# Patient Record
Sex: Female | Born: 1970 | Hispanic: No | Marital: Single | Smoking: Never smoker
Health system: Southern US, Community
[De-identification: ages and names within clinical notes are randomized; demographics above are authoritative.]

## PROBLEM LIST (undated history)

## (undated) DIAGNOSIS — Z8776 Personal history of (corrected) congenital malformations of integument, limbs and musculoskeletal system: Secondary | ICD-10-CM

## (undated) DIAGNOSIS — Z8639 Personal history of other endocrine, nutritional and metabolic disease: Secondary | ICD-10-CM

## (undated) DIAGNOSIS — Z87768 Personal history of other specified (corrected) congenital malformations of integument, limbs and musculoskeletal system: Secondary | ICD-10-CM

## (undated) DIAGNOSIS — G43909 Migraine, unspecified, not intractable, without status migrainosus: Secondary | ICD-10-CM

## (undated) DIAGNOSIS — E079 Disorder of thyroid, unspecified: Secondary | ICD-10-CM

## (undated) HISTORY — DX: Personal history of other specified (corrected) congenital malformations of integument, limbs and musculoskeletal system: Z87.768

## (undated) HISTORY — DX: Personal history of other endocrine, nutritional and metabolic disease: Z86.39

## (undated) HISTORY — DX: Personal history of (corrected) congenital malformations of integument, limbs and musculoskeletal system: Z87.76

---
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
## 2009-12-11 ENCOUNTER — Ambulatory Visit: Payer: Self-pay

## 2010-01-14 ENCOUNTER — Encounter: Payer: Self-pay | Admitting: Surgery

## 2010-01-20 ENCOUNTER — Ambulatory Visit: Payer: Self-pay | Admitting: Internal Medicine

## 2010-01-29 ENCOUNTER — Encounter: Payer: Self-pay | Admitting: Surgery

## 2010-03-01 ENCOUNTER — Encounter: Payer: Self-pay | Admitting: Surgery

## 2010-05-27 ENCOUNTER — Encounter: Payer: Self-pay | Admitting: Surgery

## 2010-05-31 ENCOUNTER — Encounter: Payer: Self-pay | Admitting: Surgery

## 2010-06-30 ENCOUNTER — Encounter: Payer: Self-pay | Admitting: Surgery

## 2011-01-04 ENCOUNTER — Ambulatory Visit: Payer: Self-pay | Admitting: Internal Medicine

## 2011-06-29 ENCOUNTER — Ambulatory Visit: Payer: Self-pay | Admitting: Internal Medicine

## 2011-07-26 ENCOUNTER — Ambulatory Visit: Payer: Self-pay | Admitting: Internal Medicine

## 2013-07-17 IMAGING — NM NM THYROID IMAGING W/ UPTAKE SINGLE (24 HR)
1 series · 3 of 3 positions shown · non-contrast
Comparison: none

REASON FOR EXAM: goiter thyroid nodules
COMMENTS:

[Series 1000: (id) thyroid scan · 2.40mm/px · 3 of 3 slices shown]
[im 1/3  full-range]
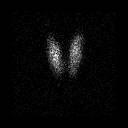
[im 2/3  full-range]
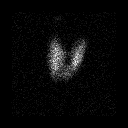
[im 3/3  full-range]
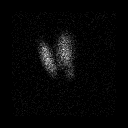

[3 of 3 positions shown; findings below may reference images not displayed]

PROCEDURE:     KNM - KNM THYROID N-TMO 24HR [DATE]  [DATE]

RESULT:     The patient received a dose of 146.037 uCi of iodine-385. Uptake
at 6 and 26 hours respectively is 14% and 24.1%. Images show relatively
homogeneous uptake diffusely in the glands without areas of abnormally
increased or decreased localization present.
IMPRESSION: Normal appearing thyroid scan and uptake.

## 2014-01-08 IMAGING — US TRANSABDOMINAL ULTRASOUND OF PELVIS
1 series · 17 of 25 positions shown · non-contrast
Comparison: none

REASON FOR EXAM: Enlarged Uterus
COMMENTS:

[Series 1: transabdominal ultrasound of pelvis · 17 of 88 slices shown]
[im 1/88]
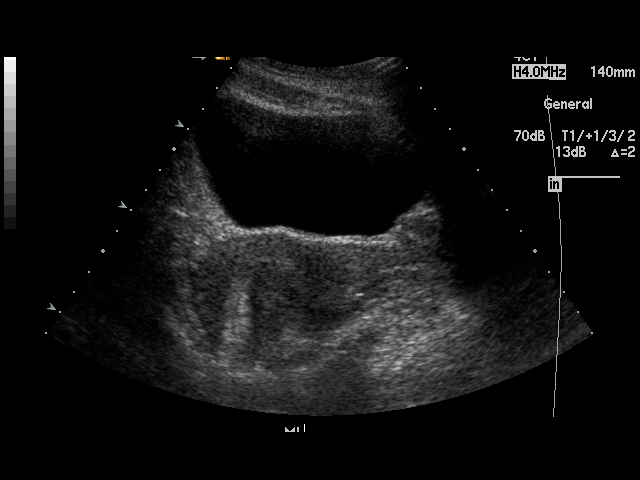
[im 8/88]
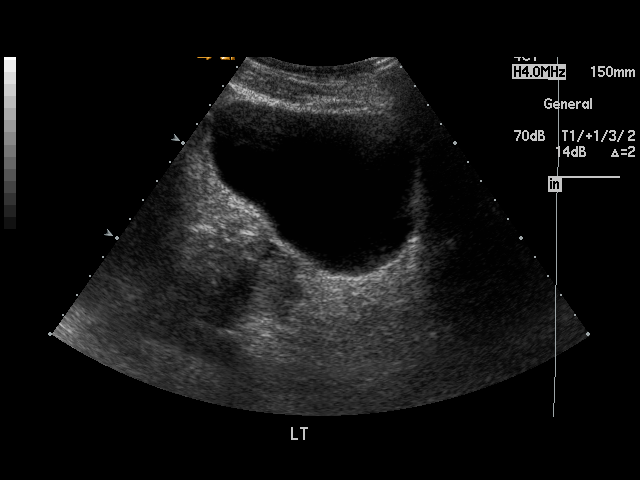
[im 11/88]
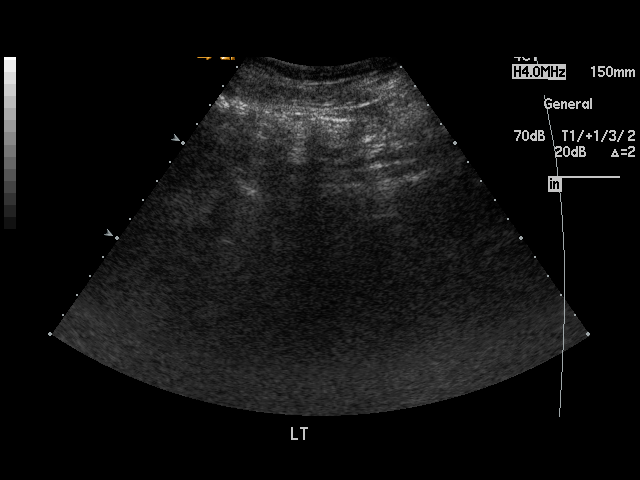
[im 19/88]
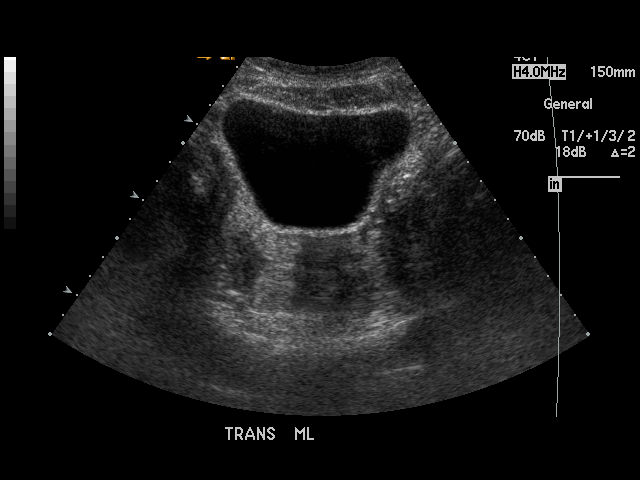
[im 22/88]
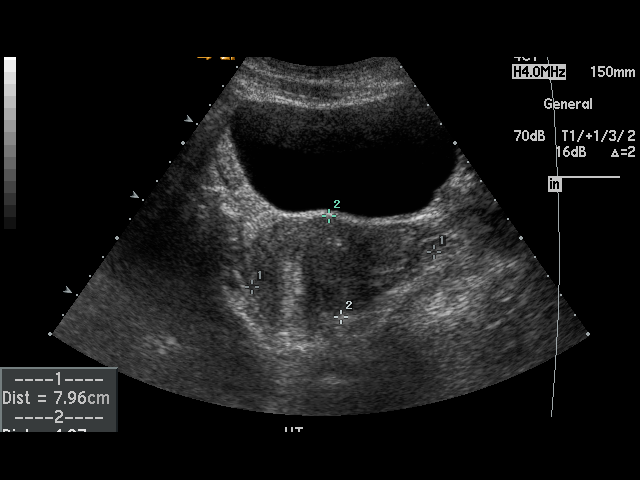
[im 30/88]
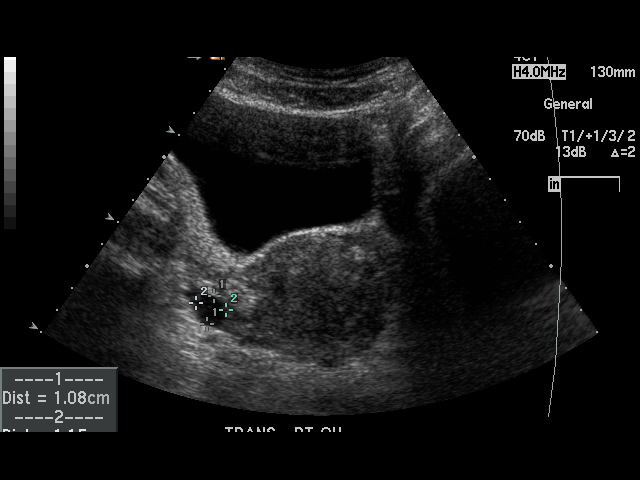
[im 33/88]
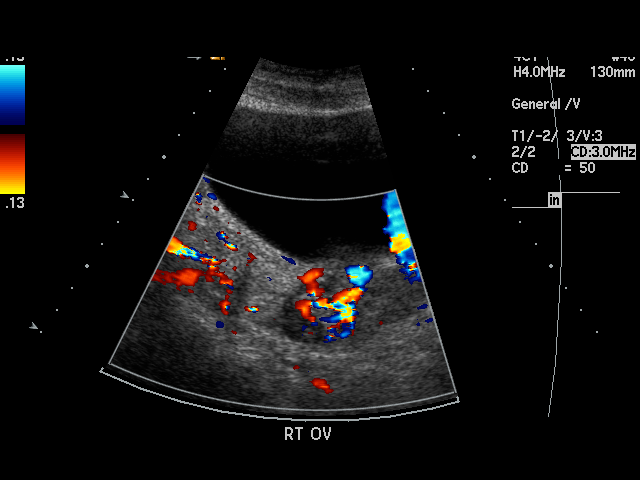
[im 40/88]
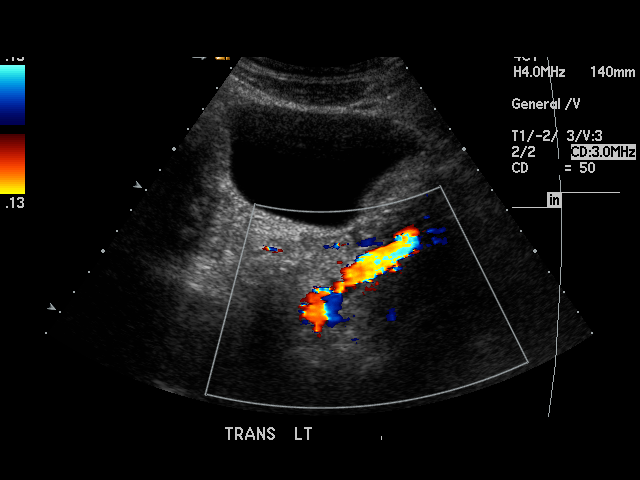
[im 44/88]
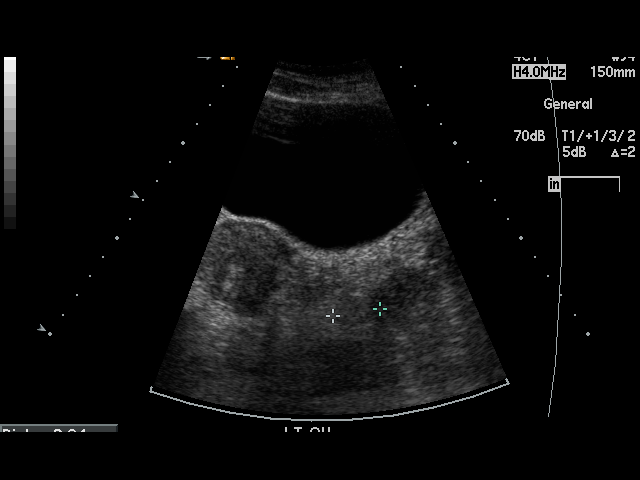
[im 48/88]
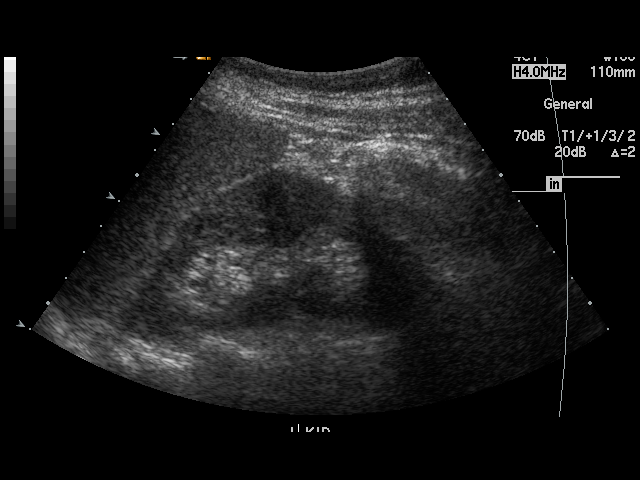
[im 55/88]
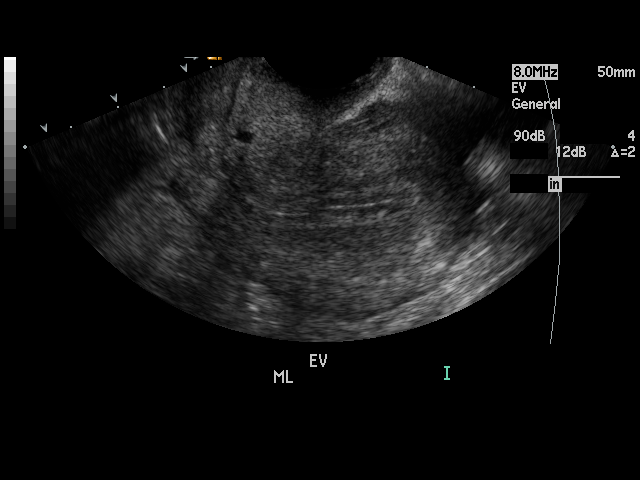
[im 59/88]
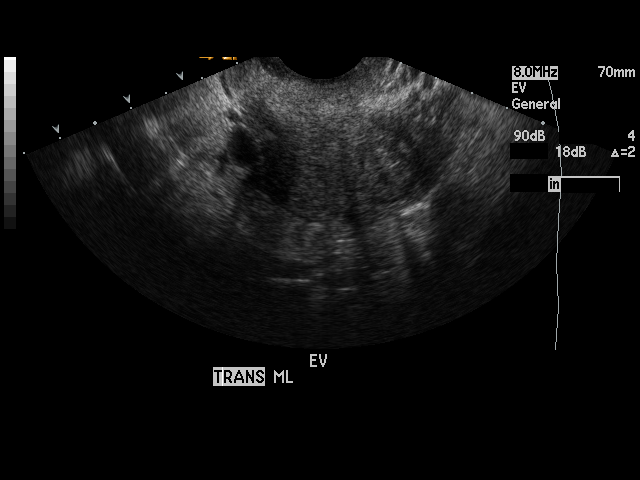
[im 66/88]
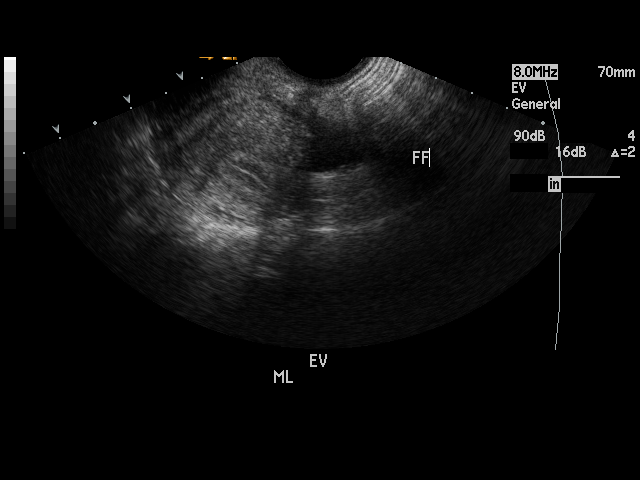
[im 69/88]
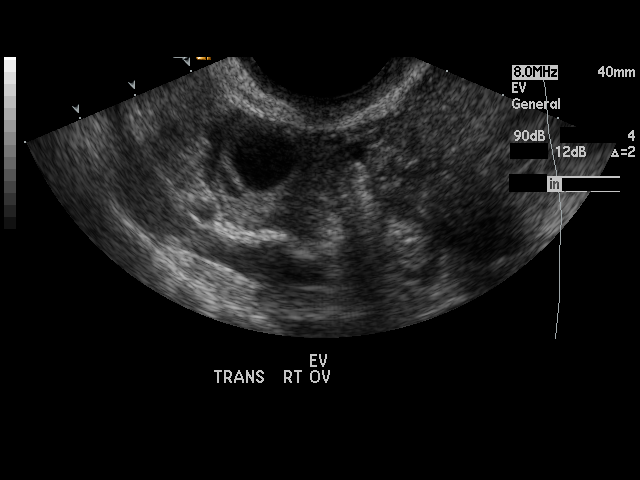
[im 77/88]
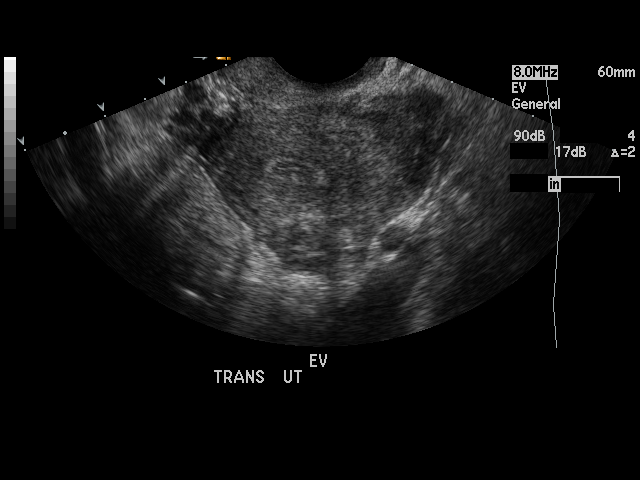
[im 80/88]
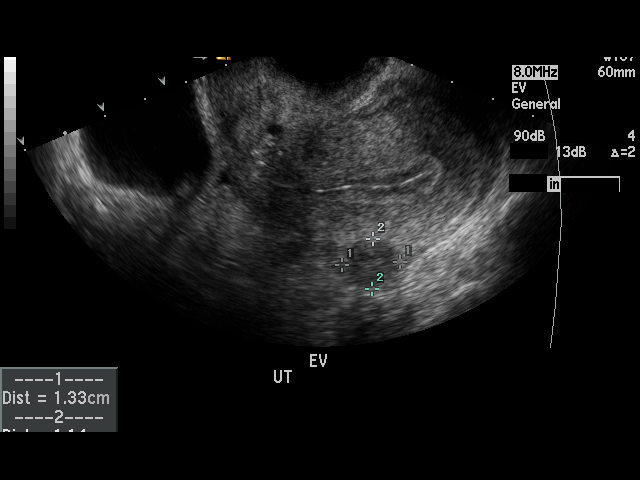
[im 88/88]
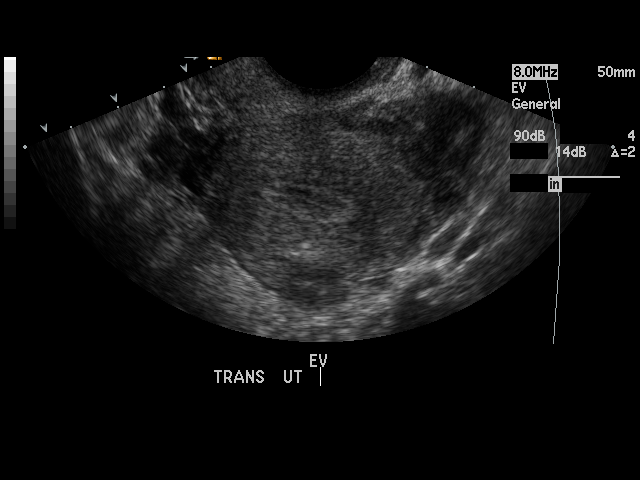

[17 of 25 positions shown; findings below may reference images not displayed]

PROCEDURE:     KURTIS - KURTIS PELVIS NON-OB W/TRANSVAGINAL  - June 29, 2011  [DATE]

RESULT:     Transabdominal and endovaginal ultrasound was performed. The
uterus measures 7.96 cm x 4.37 cm x 5.54 cm. There is a 1.72 cm hypoechoic
uterine mass consistent with a uterine fibroid that is located posteriorly
and peripherally. The endometrium measures 7.3 mm in thickness. The right
and left ovaries are visualized. The right ovary measures 2.67 cm at maximum
and the left ovary measures 2.31 cm at maximum diameter. Incidental note is
made of a 1.45 cm cyst of the right ovary. No abnormal adnexal masses are
seen. No free fluid is noted in the pelvis. There is incidentally noted a
4.5 mm cyst of the cervix. The uterus appears retroflexed. There is a
nonspecific trace of free fluid in the pelvis. The visualized portion of the
urinary bladder is normal in appearance. The kidneys are visualized
bilaterally and show no hydronephrosis.
IMPRESSION: 1. There is a 1.72 cm hypoechoic mass of the uterus consistent with a
uterine fibroid.
2. There is a nonspecific trace of free fluid in the pelvis.
3. Incidental note is made of a 1.45 cm cyst of the right ovary.
4. There is a tiny nabothian cyst.
5. The uterus appears retroflexed.

## 2014-02-04 IMAGING — MG MAM DGTL SCREENING MAMMO W/CAD
1 series · 4 of 4 positions shown · non-contrast
Comparison: none

REASON FOR EXAM: SCR MAMMO
COMMENTS:

PROCEDURE:     MAM - MAM DGTL SCREENING MAMMO W/CAD  - July 26, 2011  [DATE]
RESULT:     COMPARISON:  01/20/2010
TECHNIQUE: Digital screening mammograms were obtained. FDA approved
computer-aided detection (CAD) for mammography was utilized for this study.

[R CC · right · 4 of 4 slices shown]
[im 1/4]
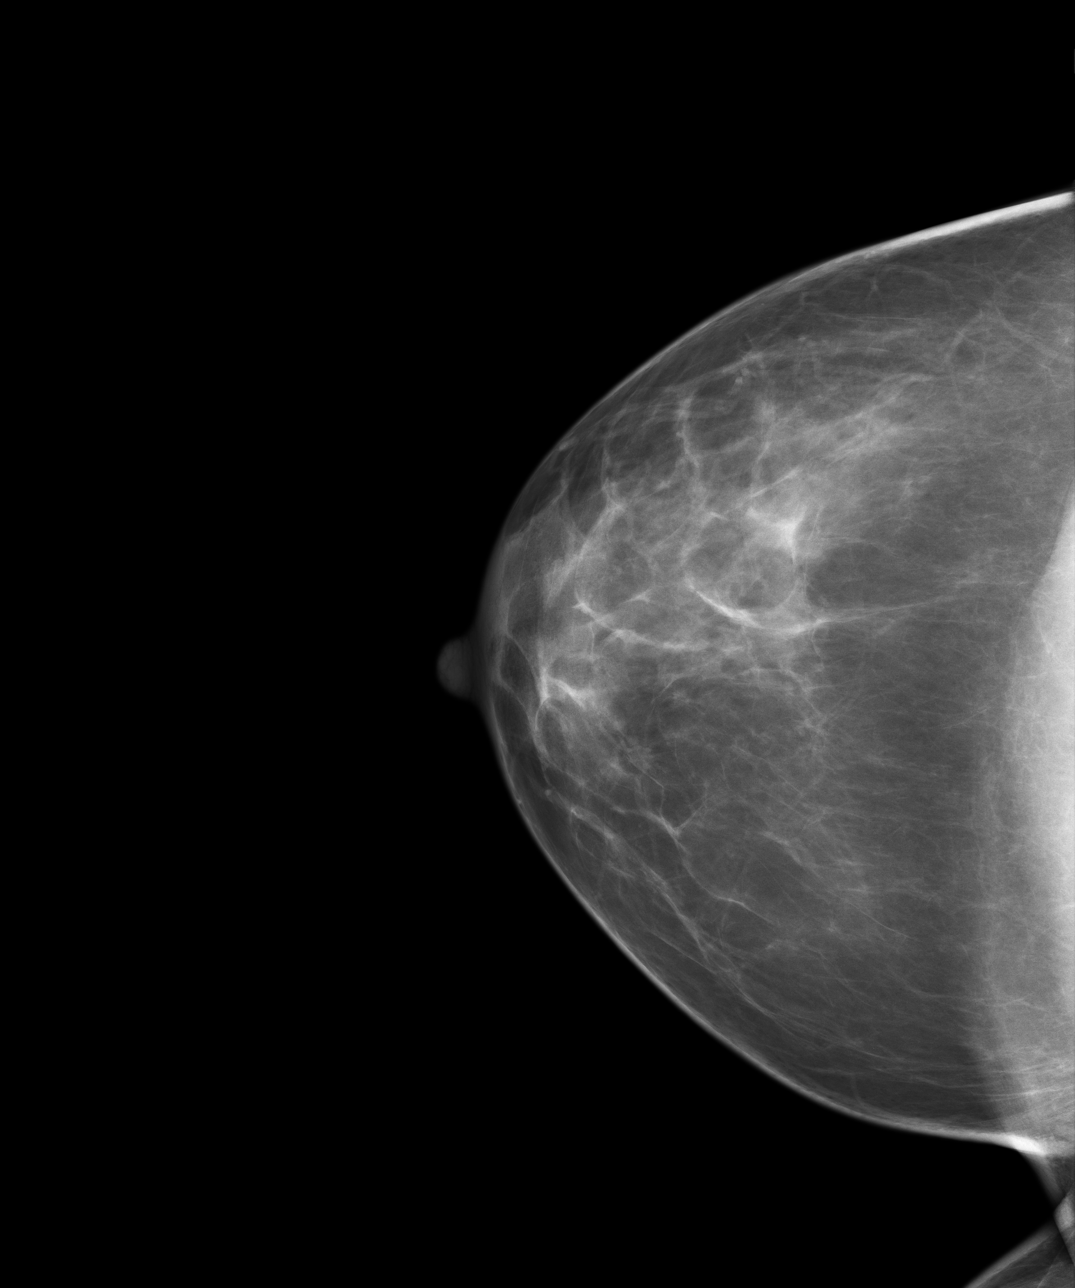
[im 2/4]
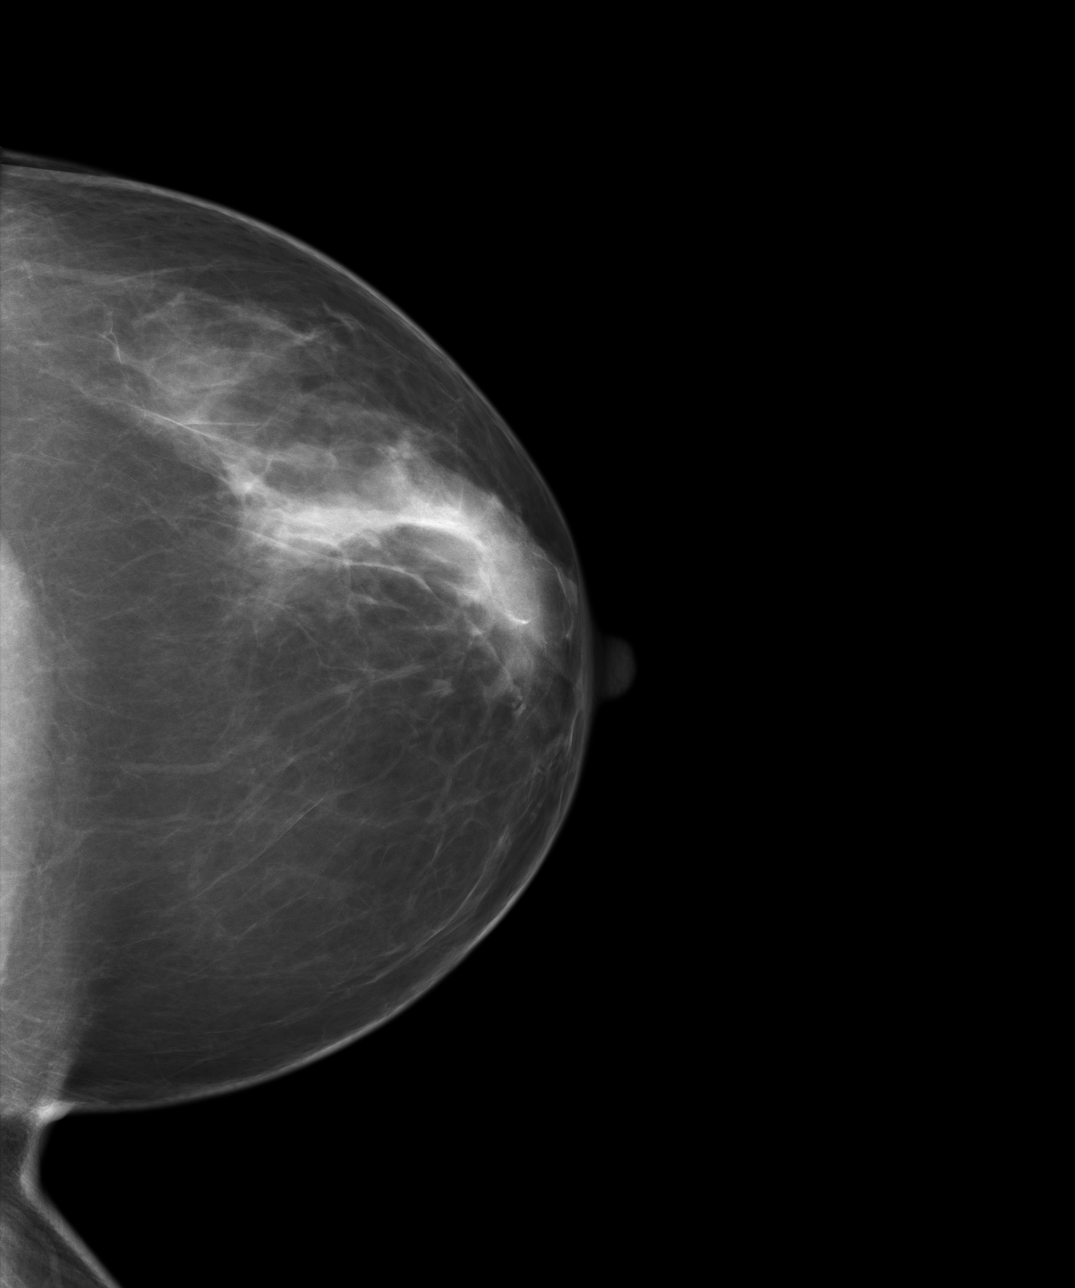
[im 3/4]
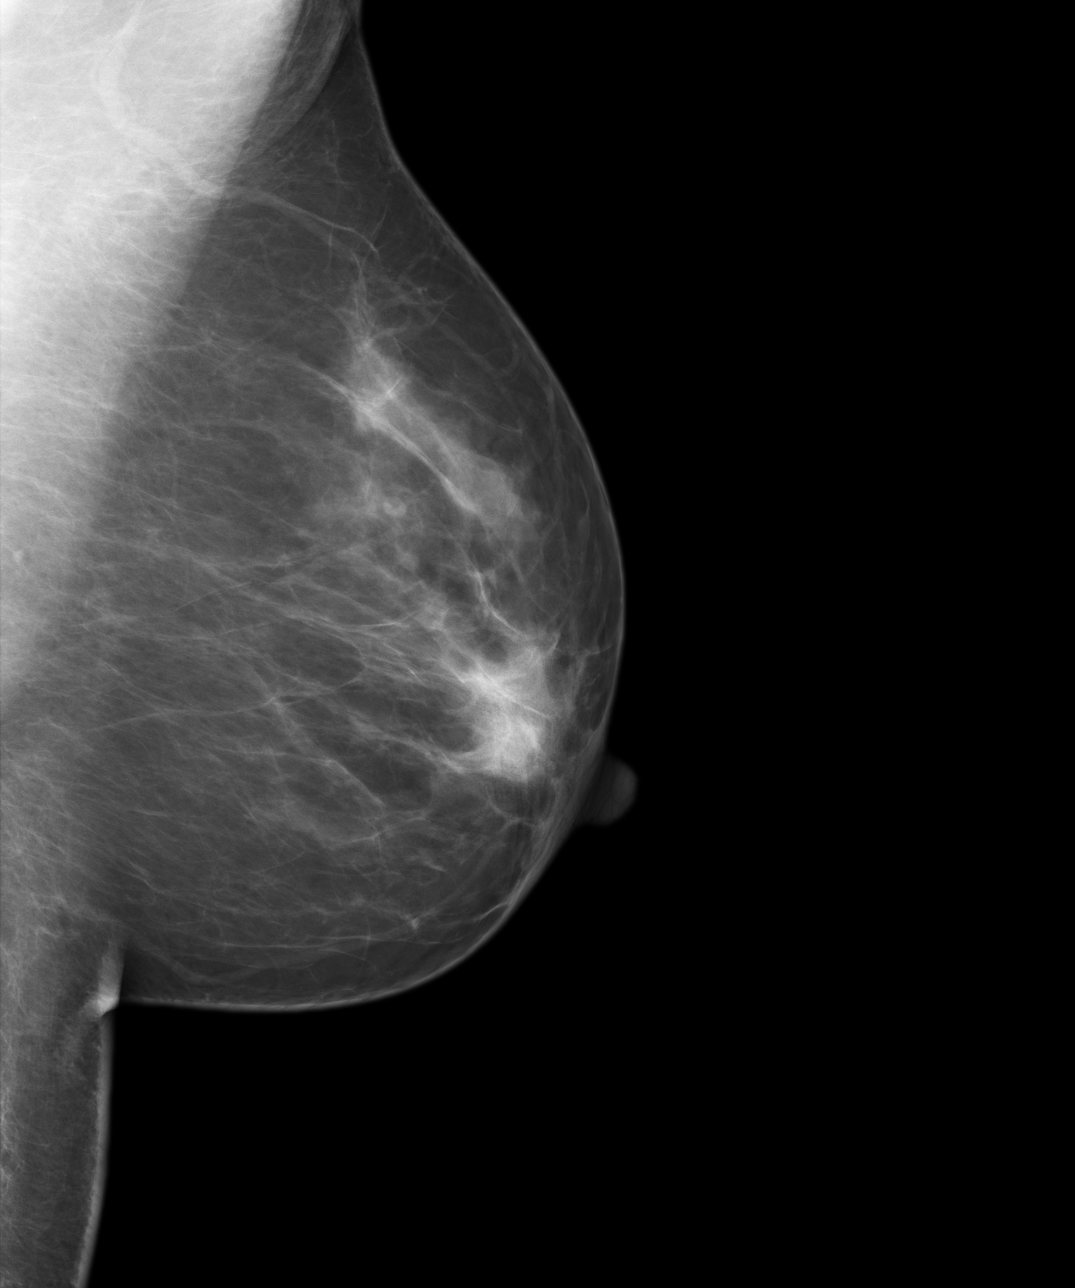
[im 4/4]
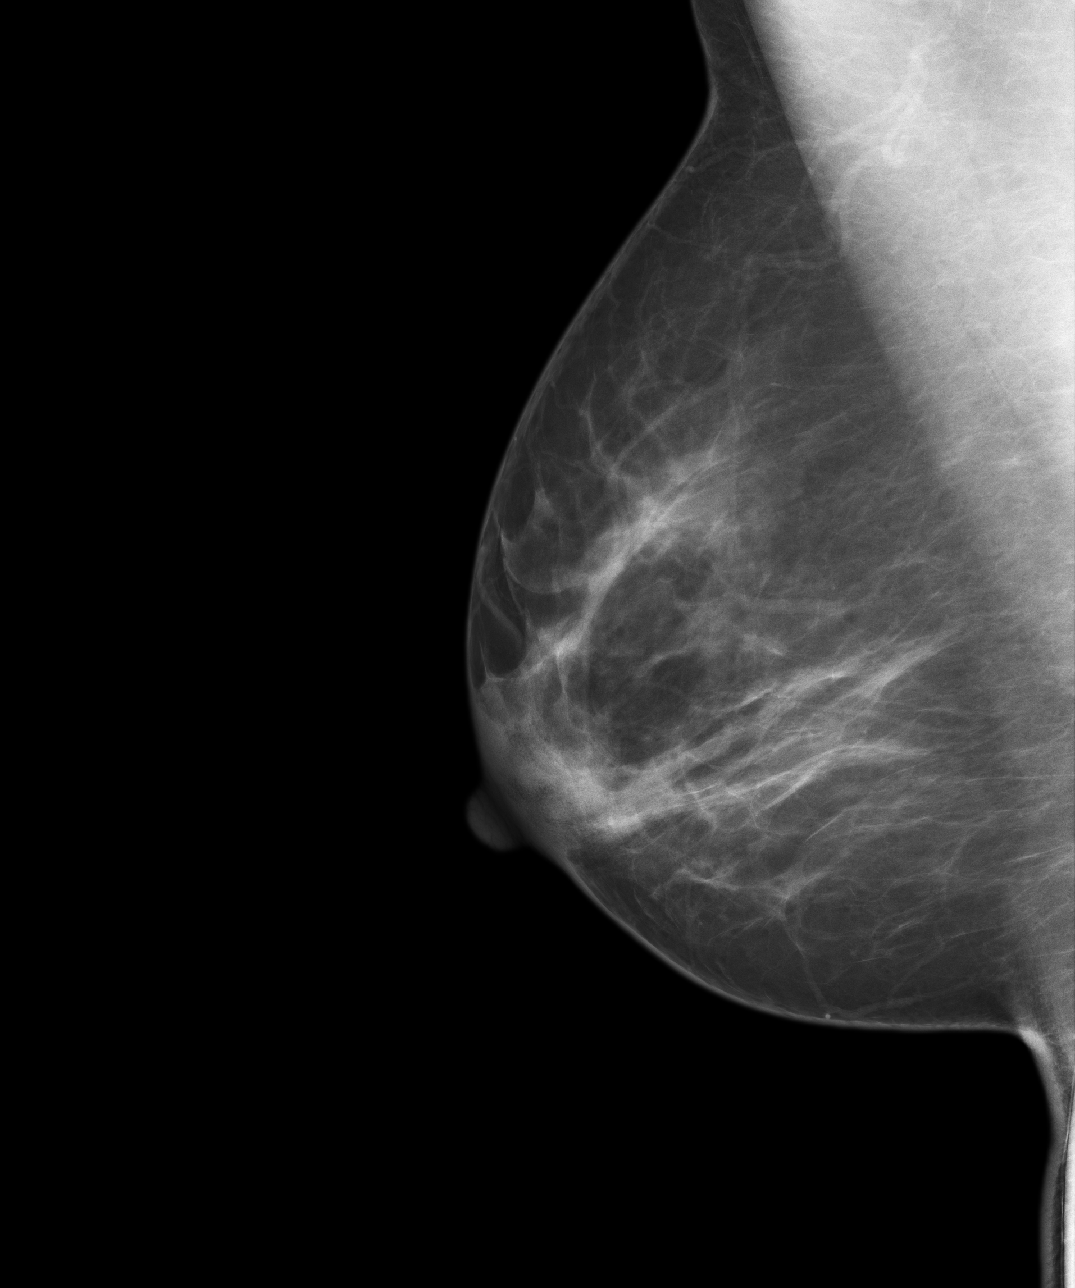

[4 of 4 positions shown; findings below may reference images not displayed]

FINDING: Bilateral breasts are heterogeneously dense which may lower the sensitivity
of mammography.  There is no dominant mass, architectural distortion or
clusters of suspicious microcalcifications.
IMPRESSION: 1.     Stable bilateral mammogram.
2.     Annual mammographic follow up recommended.
3.     BI-RADS:  Category 2- Benign.

A negative mammogram report does not preclude biopsy or other evaluation of
a clinically palpable or otherwise suspicious mass or lesion. Breast cancer
may not be detected by mammography in up to 10% of cases.

[REDACTED]

## 2018-11-23 ENCOUNTER — Encounter (HOSPITAL_COMMUNITY): Payer: Self-pay

## 2018-11-23 ENCOUNTER — Other Ambulatory Visit: Payer: Self-pay

## 2018-11-23 ENCOUNTER — Emergency Department (HOSPITAL_COMMUNITY)
Admission: EM | Admit: 2018-11-23 | Discharge: 2018-11-23 | Disposition: A | Discharge status: Home / Self Care | Payer: Medicaid Other | Attending: Emergency Medicine | Admitting: Emergency Medicine

## 2018-11-23 DIAGNOSIS — R51 Headache: Secondary | ICD-10-CM | POA: Insufficient documentation

## 2018-11-23 DIAGNOSIS — R519 Headache, unspecified: Secondary | ICD-10-CM

## 2018-11-23 HISTORY — DX: Migraine, unspecified, not intractable, without status migrainosus: G43.909

## 2018-11-23 LAB — I-STAT BETA HCG BLOOD, ED (MC, WL, AP ONLY): I-stat hCG, quantitative: <5 m[IU]/mL (ref <5)

## 2018-11-23 MED ORDER — KETOROLAC TROMETHAMINE 15 MG/ML IJ SOLN
15.0000 mg | Freq: Once | INTRAMUSCULAR | Status: AC
  Administered 2018-11-23: 15 mg via INTRAVENOUS
  Filled 2018-11-23: qty 1

## 2018-11-23 MED ORDER — SODIUM CHLORIDE 0.9 % IV BOLUS
500.0000 mL | Freq: Once | INTRAVENOUS | Status: AC
  Administered 2018-11-23: 500 mL via INTRAVENOUS

## 2018-11-23 MED ORDER — DIPHENHYDRAMINE HCL 50 MG/ML IJ SOLN
25.0000 mg | Freq: Once | INTRAMUSCULAR | Status: AC
  Administered 2018-11-23: 25 mg via INTRAVENOUS
  Filled 2018-11-23: qty 1

## 2018-11-23 MED ORDER — METOCLOPRAMIDE HCL 5 MG/ML IJ SOLN
10.0000 mg | Freq: Once | INTRAMUSCULAR | Status: AC
  Administered 2018-11-23: 10 mg via INTRAVENOUS
  Filled 2018-11-23: qty 2

## 2018-11-23 NOTE — ED Triage Notes (Signed)
Pt reports migraine since Tuesday, hx of same. Pt recently moved to [facility] and is waiting to apply for medicaid to get a refill of her migraine rx.

## 2018-11-23 NOTE — Discharge Instructions (Addendum)
You have been diagnosed today with headache.  At this time there does not appear to be the presence of an emergent medical condition, however there is always the potential for conditions to change. Please read and follow the below instructions.  Please return to the Emergency Department immediately for any new or worsening symptoms. Please be sure to follow up with your Primary Care Provider within one week regarding your visit today; please call their office to schedule an appointment even if you are feeling better for a follow-up visit. As requested you may follow-up with a local neurologist at [facility] neurology for further evaluation and treatment of your migraines.  You may call their office today to schedule a follow-up appointment. Please get plenty of rest and drink plenty of water over the next few days.  Get help right away if: Your headache gets very bad quickly. Your headache gets worse after a lot of physical activity. You keep throwing up. You have a stiff neck. You have trouble seeing. You have trouble speaking. You have pain in the eye or ear. Your muscles are weak or you lose muscle control. You lose your balance or have trouble walking. You feel like you will pass out (faint) or you pass out. You are mixed up (confused). You have a seizure. Your headache is abnormal from your normal migraines in any way. You have any new/concerning or worsening symptoms.  Please read the additional information packets attached to your discharge summary.  Do not take your medicine if  develop an itchy rash, swelling in your mouth or lips, or difficulty breathing; call 911 and seek immediate emergency medical attention if this occurs.  Note: Portions of this text may have been transcribed using voice recognition software. Every effort was made to ensure accuracy; however, inadvertent computerized transcription errors may still be present.

## 2018-11-23 NOTE — ED Provider Notes (Signed)
[facility] EMERGENCY DEPARTMENT Provider Note   CSN: [id number] Arrival date & time: 11/23/18  0844     History   Chief Complaint Chief Complaint  Patient presents with   Migraine    HPI SENAI RAMNATH is a 48 y.o. female with history of migraine presents to the ER today for the same.  She reports that she recently moved to [place] and has run out of her Imitrex prescription, she is currently awaiting refill at this time.  She reports that 2 days ago she developed a headache consistent with her previous migraines.  She describes a right-sided throbbing sensation moderate intensity constant worsened with light and without alleviating factors.  She reports that she has had 1-2 episodes of nonbloody nonbilious emesis when pain was at its greatest and she reports this is consistent with her normal migraines.  She denies fever/chills, vision changes, neck pain/stiffness, chest pain/shortness of breath, numbness/tingling, weakness, dizziness, injury/trauma or any additional concerns.   Patient seen in conjunction with [employer] and PA student Marjory Lies. HPI  Past Medical History:  Diagnosis Date   Migraine    Thyroid disease    nodule on thyroid    There are no active problems to display for this patient.   Past Surgical History:  Procedure Laterality Date   HIP SURGERY       OB History   No obstetric history on file.      Home Medications    Prior to Admission medications   Not on File    Family History No family history on file.  Social History Social History   Tobacco Use   Smoking status: Never Smoker  Substance Use Topics   Alcohol use: Yes    Comment: occaissionally   Drug use: Never     Allergies   Patient has no allergy information on record.   Review of Systems Review of Systems Ten systems are reviewed and are negative for acute change except as noted in the HPI   Physical Exam Updated Vital Signs BP 115/85 (BP  Location: Right Arm)    Pulse 90    Temp 98.6 F (37 C) (Oral)    Resp 20    Ht 5\' 1"  (1.549 m)    Wt 63.5 kg    SpO2 99%    BMI 26.45 kg/m   Physical Exam Constitutional:      General: She is not in acute distress.    Appearance: Normal appearance. She is well-developed. She is not ill-appearing or diaphoretic.  HENT:     Head: Normocephalic and atraumatic. No raccoon eyes, Battle's sign, abrasion or contusion.     Jaw: There is normal jaw occlusion. No trismus.     Right Ear: External ear normal.     Left Ear: External ear normal.     Ears:     Comments: Hearing grossly intact bilaterally    Nose: Nose normal. No rhinorrhea.     Right Nostril: No epistaxis.     Left Nostril: No epistaxis.     Mouth/Throat:     Mouth: Mucous membranes are moist.     Pharynx: Oropharynx is clear.  Eyes:     General: Vision grossly intact. Gaze aligned appropriately.     Extraocular Movements: Extraocular movements intact.     Conjunctiva/sclera: Conjunctivae normal.     Pupils: Pupils are equal, round, and reactive to light.     Comments: Visual fields grossly intact bilaterally  Neck:  Musculoskeletal: Full passive range of motion without pain, normal range of motion and neck supple. No neck rigidity.     Trachea: Trachea and phonation normal. No tracheal tenderness or tracheal deviation.     Meningeal: Brudzinski's sign absent.  Cardiovascular:     Rate and Rhythm: Normal rate and regular rhythm.     Pulses:          Dorsalis pedis pulses are 2+ on the right side and 2+ on the left side.     Heart sounds: Normal heart sounds.  Pulmonary:     Effort: Pulmonary effort is normal. No respiratory distress.     Breath sounds: Normal breath sounds and air entry.  Abdominal:     General: Bowel sounds are normal. There is no distension.     Palpations: Abdomen is soft.     Tenderness: There is no abdominal tenderness. There is no guarding or rebound.  Musculoskeletal:     Comments: No midline  C/T/L spinal tenderness to palpation, no paraspinal muscle tenderness, no deformity, crepitus, or step-off noted. No sign of injury to the neck or back.  Feet:     Right foot:     Protective Sensation: 3 sites tested. 3 sites sensed.     Left foot:     Protective Sensation: 3 sites tested. 3 sites sensed.  Skin:    General: Skin is warm and dry.  Neurological:     Mental Status: She is alert and oriented to person, place, and time.     GCS: GCS eye subscore is 4. GCS verbal subscore is 5. GCS motor subscore is 6.     Comments: Mental Status: Alert, oriented, thought content appropriate, able to give a coherent history. Speech fluent without evidence of aphasia. Able to follow 2 step commands without difficulty. Cranial Nerves: II: Peripheral visual fields grossly normal, pupils equal, round, reactive to light III,IV, VI: ptosis not present, extra-ocular motions intact bilaterally V,VII: smile symmetric, eyebrows raise symmetric, facial light touch sensation equal VIII: hearing grossly normal to voice X: uvula elevates symmetrically XI: bilateral shoulder shrug symmetric and strong XII: midline tongue extension without fassiculations Motor: Normal tone. 5/5 strength in upper and lower extremities bilaterally including strong and equal grip strength and dorsiflexion/plantar flexion Sensory: Sensation intact to light touch in all extremities.Negative Romberg.  Deep Tendon Reflexes: 2+ and symmetric in the biceps and patella Cerebellar: normal finger-to-nose maze with bilateral upper extremities. Normal heel-to -shin balance bilaterally of the lower extremity. No pronator drift.  Gait: normal gait and balance CV: distal pulses palpable throughout  Psychiatric:        Mood and Affect: Mood normal.        Behavior: Behavior is cooperative.      ED Treatments / Results  Labs (all labs ordered are listed, but only abnormal results are displayed) Labs Reviewed  I-STAT BETA HCG  BLOOD, ED ([facility], [facility], [facility] ONLY)    EKG None  Radiology No results found.  Procedures Procedures (including critical care time)  Medications Ordered in ED Medications  diphenhydrAMINE (BENADRYL) injection 25 mg (25 mg Intravenous Given 11/23/18 1423)  metoCLOPramide (REGLAN) injection 10 mg (10 mg Intravenous Given 11/23/18 1424)  sodium chloride 0.9 % bolus 500 mL (0 mLs Intravenous Stopped 11/23/18 1457)  ketorolac (TORADOL) 15 MG/ML injection 15 mg (15 mg Intravenous Given 11/23/18 1559)     Initial Impression / Assessment and Plan / ED Course  I have reviewed the triage vital signs and the nursing  notes.  Pertinent labs & imaging results that were available during my care of the patient were reviewed by me and considered in my medical decision making (see chart for details).    Chucky Mayoya T Skaggs is a 48 y.o. female who presents to ED for 2 days of headache. Patient states that their headache is consistent with their typical headache in the past.  Physical examination reassuring without focal neuro deficit.  No history of syncope, sudden onset, neck stiffness, trauma, recent pregnancy or pain about the temporal or cervical arteries.  She is overall well-appearing and in no acute distress.  No imaging indicated at this time.  Patient treated with migraine cocktail of IV fluids, Benadryl, Reglan and Toradol, of note patient denies history of CKD or gastric ulcers, negative pregnancy test today. - Patient reassessed she reports headache is improved in the ER and is requesting to be discharged back to home.  On re-evaluation, patient sleeping easily arousable.  The patient denies any neurologic symptoms such as visual changes, focal numbness/weakness, balance problems, confusion, or speech difficulty to suggest a life-threatening intracranial process such as intracranial hemorrhage or mass. The patient has no clotting risk factors thus venous sinus thrombosis is unlikely. No fevers, neck pain or  nuchal rigidity to suggest meningitis. Patient is afebrile, non-toxic and well appearing. Reassuring neuro exam, normal gait around room and back. No cranial deficits, no speech deficits, negative pronator drift, normal/equal strength to all extremities.   PCP follow up encouraged.  As patient is returning to this area she is requesting become reestablished with a local neurologist, I have given [facility] for further migraine treatment.  She reports that her primary care provider is to refill the meds. I have reviewed return precautions including development of fever, nausea/vomiting or neurologic symptoms, vision changes, confusion, lethargy, difficulty speaking/walking, or other new/worsening/concerning symptoms. Patient states understanding of return precautions.   At this time there does not appear to be any evidence of an acute emergency medical condition and the patient appears stable for discharge with appropriate outpatient follow up. Diagnosis was discussed with patient who verbalizes understanding of care plan and is agreeable to discharge. I have discussed return precautions with patient who verbalizes understanding of return precautions. Patient encouraged to follow-up with their PCP and neurology. All questions answered.  Patient has been discharged in good condition.   Note: Portions of this report may have been transcribed using voice recognition software. Every effort was made to ensure accuracy; however, inadvertent computerized transcription errors may still be present. Final Clinical Impressions(s) / ED Diagnoses   Final diagnoses:  Nonintractable headache, unspecified chronicity pattern, unspecified headache type    ED Discharge Orders    None       Elizabeth PalauMorelli, Bobbette Eakes A, PA-C 11/23/18 1728    Milagros Lollykstra, Richard S, MD 11/25/18 0201

## 2019-02-14 ENCOUNTER — Ambulatory Visit: Payer: Self-pay | Admitting: Family Medicine

## 2019-03-14 ENCOUNTER — Other Ambulatory Visit: Payer: Self-pay

## 2019-03-14 ENCOUNTER — Encounter: Payer: Self-pay | Admitting: Family Medicine

## 2019-03-14 ENCOUNTER — Ambulatory Visit: Payer: Medicaid Other | Attending: Family Medicine | Admitting: Family Medicine

## 2019-03-14 DIAGNOSIS — Z87768 Personal history of other specified (corrected) congenital malformations of integument, limbs and musculoskeletal system: Secondary | ICD-10-CM | POA: Insufficient documentation

## 2019-03-14 DIAGNOSIS — G43C0 Periodic headache syndromes in child or adult, not intractable: Secondary | ICD-10-CM | POA: Diagnosis not present

## 2019-03-14 DIAGNOSIS — Z96643 Presence of artificial hip joint, bilateral: Secondary | ICD-10-CM | POA: Insufficient documentation

## 2019-03-14 DIAGNOSIS — Z8776 Personal history of (corrected) congenital malformations of integument, limbs and musculoskeletal system: Secondary | ICD-10-CM | POA: Diagnosis not present

## 2019-03-14 DIAGNOSIS — Z1231 Encounter for screening mammogram for malignant neoplasm of breast: Secondary | ICD-10-CM

## 2019-03-14 MED ORDER — SUMATRIPTAN SUCCINATE 6 MG/0.5ML ~~LOC~~ SOLN: 6.0000 mg | SUBCUTANEOUS | 4 refills | Status: DC | PRN

## 2019-03-14 MED ORDER — IBUPROFEN 600 MG PO TABS: 600.0000 mg | ORAL_TABLET | Freq: Three times a day (TID) | ORAL | 2 refills | Status: DC | PRN

## 2019-03-14 NOTE — Progress Notes (Signed)
Pt states she has chronic migraines   Pt states she takes ibuprofen 800 and Imitrex injections   Pt states her migraines last 3-4 days   Pt states she has been neurologist

## 2019-03-14 NOTE — Progress Notes (Signed)
Virtual Visit via Telephone Note  I connected with April Callahan on 03/14/19 at  3:50 PM EST by telephone and verified that I am speaking with the correct person using two identifiers.   I discussed the limitations, risks, security and privacy concerns of performing an evaluation and management service by telephone and the availability of in person appointments. I also discussed with the patient that there may be a patient responsible charge related to this service. The patient expressed understanding and agreed to proceed.  Patient Location: home Provider Location: [facility] Others participating in call: none   History of Present Illness:       49 yo female who is seen to establish care. She has a history of migraines for which she has been on Imitrex injections.  She alternates the Imitrex with ibuprofen.  She is current of Imitrex since she moved to the area.  She believes that she was prescribed Topamax in the past as a preventative but was afraid to take the medication.  She sometimes has as little as 2 migraines per month and at the worst she has had up to 15 migraines in 1 month.  She reports a history of a thyroid nodule that was being monitored as the nodule is too small to be biopsied.. 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  She would like a referral to establish with orthopedics as she is new to the area after moving from [place] recently.  She believes that she will need a mammogram this year.  Her prior mammograms have been normal.  She believes that her last Pap smear was about 2 years ago and was normal at that time.  She has some mild fatigue but denies any issues with chest pain or palpitations, no shortness of breath or cough, no abdominal pain.  She does have some occasional nausea with her headaches but finds that medications for the treatment of nausea did not work for her.  She denies any urinary frequency, urgency or  dysuria.  Past Medical History:  Diagnosis Date  . Migraine   . Thyroid disease    nodule on thyroid    Past Surgical History:  Procedure Laterality Date  . HIP SURGERY      No family history on file.  Social History   Tobacco Use  . Smoking status: Never Smoker  Substance Use Topics  . Alcohol use: Yes    Comment: occaissionally  . Drug use: Never     No Known Allergies     Observations/Objective: No vital signs or physical exam conducted as visit was done via telephone  Assessment and Plan: 1. Periodic headache syndrome, not intractable She reports a history of migraine headaches for which she has seen neurology in the past.  She states that Imitrex injections have worked well for her in the past and she would like to have a new prescription.  She is also alternated the use of Imitrex with ibuprofen in the past and this worked well.  New prescriptions will be sent to patient's pharmacy and patient is to follow-up in approximately 4 months but sooner if she is having any problems or concerns. - SUMAtriptan (IMITREX) 6 MG/0.5ML SOLN injection; Inject 0.5 mLs (6 mg total) into the skin every 2 (two) hours as needed for migraine or headache. Maximum 2 shots per day  Dispense: 0.5 mL; Refill: 4 - ibuprofen (ADVIL) 600 MG tablet; Take 1 tablet (600 mg  total) by mouth every 8 (eight) hours as needed. Take after eating  Dispense: 60 tablet; Refill: 2  2. History of congenital dysplasia of hip 3. S/P hip replacement, bilateral She reports a history of congenital hip dysplasia for which she had bilateral hip replacements in 2011.  She will be referred to orthopedics to establish care for follow-up as she is new to the area. - Ambulatory referral to Orthopedic Surgery  4.  Screening for breast cancer Order placed for screening mammogram  Follow Up Instructions: 75-month follow-up of chronic issues including migraines, thyroid nodule and blood work; schedule for annual well exam      I discussed the assessment and treatment plan with the patient. The patient was provided an opportunity to ask questions and all were answered. The patient agreed with the plan and demonstrated an understanding of the instructions.   The patient was advised to call back or seek an in-person evaluation if the symptoms worsen or if the condition fails to improve as anticipated.  I provided 14 minutes of non-face-to-face time during this encounter.   Cain Saupe, MD

## 2019-03-15 ENCOUNTER — Telehealth: Payer: Self-pay | Admitting: General Practice

## 2019-03-15 NOTE — Telephone Encounter (Signed)
Patient called asking that you switch the SUMAtriptan (IMITREX) 6 MG/0.5ML SOLN injection [[id number]]  To the already preloaded medication like and epi pen and send it to  [facility] [address], [place] - [address]  [address] [place], [place] [address]  Phone:  [phone] Fax:  [phone]  DEA #:  --

## 2019-03-17 ENCOUNTER — Other Ambulatory Visit: Payer: Self-pay | Admitting: Family Medicine

## 2019-03-17 DIAGNOSIS — G43909 Migraine, unspecified, not intractable, without status migrainosus: Secondary | ICD-10-CM

## 2019-03-17 MED ORDER — SUMATRIPTAN SUCCINATE 6 MG/0.5ML ~~LOC~~ SOAJ: SUBCUTANEOUS | 5 refills | Status: DC

## 2019-03-17 NOTE — Progress Notes (Signed)
Patient ID: April Callahan, female   DOB: [birth year], 49 y.o.   MRN: [id number]   Patient requested Stat dose administration form of Imitrex.  New prescription sent to her pharmacy.

## 2019-03-19 NOTE — Telephone Encounter (Signed)
New RX was sent this weekend

## 2019-03-27 ENCOUNTER — Other Ambulatory Visit: Payer: Self-pay

## 2019-03-27 ENCOUNTER — Ambulatory Visit (INDEPENDENT_AMBULATORY_CARE_PROVIDER_SITE_OTHER): Payer: Medicaid Other | Admitting: Orthopaedic Surgery

## 2019-03-27 ENCOUNTER — Telehealth: Payer: Self-pay

## 2019-03-27 ENCOUNTER — Encounter: Payer: Self-pay | Admitting: Orthopaedic Surgery

## 2019-03-27 ENCOUNTER — Ambulatory Visit (INDEPENDENT_AMBULATORY_CARE_PROVIDER_SITE_OTHER): Payer: Medicaid Other

## 2019-03-27 VITALS — Ht 61.0 in | Wt 130.0 lb

## 2019-03-27 DIAGNOSIS — Z96642 Presence of left artificial hip joint: Secondary | ICD-10-CM

## 2019-03-27 DIAGNOSIS — Z96641 Presence of right artificial hip joint: Secondary | ICD-10-CM | POA: Diagnosis not present

## 2019-03-27 NOTE — Progress Notes (Signed)
Office Visit Note   Patient: April Callahan           Date of Birth: 1970/09/30           MRN: [id number] Visit Date: 03/27/2019              Requested by: Cain Saupe, MD [address] [place],  [place] [address] PCP: Patient, No Pcp Per   Assessment & Plan: Visit Diagnoses:  1. Status post total hip replacement, right   2. Status post total hip replacement, left     Plan: Impression is 10 years status post bilateral hip replacements.  These are stable and doing well.  We will see her back in about 2 years for follow-up.  Standing AP pelvis and lateral hips on return.  She is currently working and doing well with this.   Follow-Up Instructions: Return in about 2 years (around 03/26/2021).   Orders:  Orders Placed This Encounter  Procedures  . XR HIPS BILAT W OR W/O PELVIS 3-4 VIEWS   No orders of the defined types were placed in this encounter.     Procedures: No procedures performed   Clinical Data: No additional findings.   Subjective: Chief Complaint  Patient presents with  . Right Hip - Pain  . Left Hip - Pain    April Callahan is a very pleasant 49 year old female comes in today to establish care for her hip replacements that were performed in [place] 10 years ago.  She has done very well and reports no problems.  She has some soreness with prolonged walking but she does not take anything for pain.   Review of Systems  Constitutional: Negative.   HENT: Negative.   Eyes: Negative.   Respiratory: Negative.   Cardiovascular: Negative.   Endocrine: Negative.   Musculoskeletal: Negative.   Neurological: Negative.   Hematological: Negative.   Psychiatric/Behavioral: Negative.   All other systems reviewed and are negative.    Objective: Vital Signs: Ht 5\' 1"  (1.549 m)   Wt 130 lb (59 kg)   BMI 24.56 kg/m   Physical Exam Vitals and nursing note reviewed.  Constitutional:      Appearance: She is well-developed.  HENT:     Head: Normocephalic  and atraumatic.  Pulmonary:     Effort: Pulmonary effort is normal.  Abdominal:     Palpations: Abdomen is soft.  Musculoskeletal:     Cervical back: Neck supple.  Skin:    General: Skin is warm.     Capillary Refill: Capillary refill takes less than 2 seconds.  Neurological:     Mental Status: She is alert and oriented to person, place, and time.  Psychiatric:        Behavior: Behavior normal.        Thought Content: Thought content normal.        Judgment: Judgment normal.     Ortho Exam Bilateral hip exams show fully healed surgical scars.  She has painless range of motion of the hips. Specialty Comments:  No specialty comments available.  Imaging: XR HIPS BILAT W OR W/O PELVIS 3-4 VIEWS  Result Date: 03/27/2019 Stable total hip replacements without complication.  Small area of lucency between the interface superior cup and acetabulum    PMFS History: Patient Active Problem List   Diagnosis Date Noted  . Status post total hip replacement, left 03/27/2019  . Status post total hip replacement, right 03/27/2019  . History of congenital dysplasia of hip 03/14/2019  .  S/P hip replacement, bilateral 03/14/2019   Past Medical History:  Diagnosis Date  . History of congenital dysplasia of hip   . History of thyroid nodule    nodule on thyroid  . Migraine     Family History  Problem Relation Age of Onset  . Seizures Mother   . Heart disease Mother   . Osteoarthritis Father   . Colon cancer Maternal Grandmother   . Stomach cancer Maternal Grandfather     Past Surgical History:  Procedure Laterality Date  . Bilateral hip replacement Bilateral 2011   Social History   Occupational History  . Not on file  Tobacco Use  . Smoking status: Never Smoker  . Smokeless tobacco: Never Used  Substance and Sexual Activity  . Alcohol use: Yes    Comment: occaissionally  . Drug use: Never  . Sexual activity: Not on file

## 2019-03-27 NOTE — Telephone Encounter (Signed)
Please inform patient and see which option she would like to use

## 2019-03-27 NOTE — Telephone Encounter (Signed)
Patients Medicaid ins does not cover the Sumatriptan auto injectors.  They cover the tablet, nasal spray or vial only.  If appropriate, can you send in a script to [facility]

## 2019-04-05 ENCOUNTER — Ambulatory Visit: Payer: Medicaid Other | Admitting: Family Medicine

## 2019-04-11 NOTE — Telephone Encounter (Signed)
Pt requests the Sumatriptan Nasal spray be sent to the [facility] at [facility].  This should be covered under her Medicaid.

## 2019-04-12 ENCOUNTER — Other Ambulatory Visit: Payer: Self-pay | Admitting: Family Medicine

## 2019-04-12 DIAGNOSIS — G43909 Migraine, unspecified, not intractable, without status migrainosus: Secondary | ICD-10-CM

## 2019-04-12 MED ORDER — SUMATRIPTAN 20 MG/ACT NA SOLN: NASAL | 4 refills | Status: DC

## 2019-04-12 NOTE — Progress Notes (Signed)
Patient ID: April Callahan, female   DOB: 1970/04/30, 49 y.o.   MRN: [id number]   Patient requesting that sumatriptan nasal spray be sent to her pharmacy as she believes that this RX will be covered by her insurance

## 2019-05-08 ENCOUNTER — Ambulatory Visit
Admission: RE | Admit: 2019-05-08 | Discharge: 2019-05-08 | Disposition: A | Discharge status: Home / Self Care | Payer: Medicaid Other | Source: Ambulatory Visit | Attending: Family Medicine | Admitting: Family Medicine

## 2019-05-08 ENCOUNTER — Other Ambulatory Visit: Payer: Self-pay

## 2019-05-08 DIAGNOSIS — Z1231 Encounter for screening mammogram for malignant neoplasm of breast: Secondary | ICD-10-CM | POA: Diagnosis not present

## 2019-05-16 ENCOUNTER — Other Ambulatory Visit: Payer: Self-pay | Admitting: Family Medicine

## 2019-05-16 DIAGNOSIS — R928 Other abnormal and inconclusive findings on diagnostic imaging of breast: Secondary | ICD-10-CM

## 2019-05-28 ENCOUNTER — Telehealth: Payer: Self-pay | Admitting: *Deleted

## 2019-05-28 NOTE — Telephone Encounter (Signed)
Prior Auth for patient Left Breast Ultrasound completed 05-28-19    Authorization Number:  [id number]  Auth End Date:  11/24/2019  Service Order:  [id number]

## 2019-05-31 ENCOUNTER — Other Ambulatory Visit: Payer: Medicaid Other

## 2019-07-02 DIAGNOSIS — H5213 Myopia, bilateral: Secondary | ICD-10-CM | POA: Diagnosis not present

## 2019-07-24 DIAGNOSIS — H5203 Hypermetropia, bilateral: Secondary | ICD-10-CM | POA: Diagnosis not present

## 2019-07-31 ENCOUNTER — Telehealth: Payer: Self-pay

## 2019-07-31 ENCOUNTER — Other Ambulatory Visit: Payer: Self-pay

## 2019-07-31 DIAGNOSIS — G43C0 Periodic headache syndromes in child or adult, not intractable: Secondary | ICD-10-CM

## 2019-07-31 DIAGNOSIS — G43909 Migraine, unspecified, not intractable, without status migrainosus: Secondary | ICD-10-CM

## 2019-07-31 MED ORDER — SUMATRIPTAN 20 MG/ACT NA SOLN: NASAL | 4 refills | Status: DC

## 2019-07-31 MED ORDER — IBUPROFEN 600 MG PO TABS: 600.0000 mg | ORAL_TABLET | Freq: Three times a day (TID) | ORAL | 2 refills | Status: AC | PRN

## 2019-07-31 MED ORDER — IBUPROFEN 600 MG PO TABS: 600.0000 mg | ORAL_TABLET | Freq: Three times a day (TID) | ORAL | 2 refills | Status: DC | PRN

## 2019-07-31 NOTE — Telephone Encounter (Signed)
1) Medication(s) Requested (by name): motrin & injectable imitrex  2) Pharmacy of Choice: [facility]   3) Special Requests:   Approved medications will be sent to the pharmacy, we will reach out if there is an issue.  Requests made after 3pm may not be addressed until the following business day!  If a patient is unsure of the name of the medication(s) please note and ask patient to call back when they are able to provide all info, do not send to responsible party until all information is available!

## 2019-07-31 NOTE — Telephone Encounter (Addendum)
[facility] [address]  Cannot fill it because [facility] already filled it and billed Medicaid. Pt says she has to have [facility] for Medicaid to pay. Please call [facility] and cancel so [facility] and bill it to Medicaid.

## 2019-07-31 NOTE — Telephone Encounter (Signed)
Rx sent.   Marcy Siren, D.O. Primary Care at [facility]  07/31/2019, 10:22 AM

## 2019-08-01 NOTE — Telephone Encounter (Signed)
Medication Sumatriptan  needs a PA. Pharmacist is going to ax over form.

## 2019-08-02 ENCOUNTER — Telehealth: Payer: Self-pay

## 2019-08-10 NOTE — Progress Notes (Signed)
Patient did not show for appointment.   

## 2019-08-13 ENCOUNTER — Ambulatory Visit (HOSPITAL_BASED_OUTPATIENT_CLINIC_OR_DEPARTMENT_OTHER): Payer: Medicaid Other | Admitting: Family

## 2019-08-13 DIAGNOSIS — Z5329 Procedure and treatment not carried out because of patient's decision for other reasons: Secondary | ICD-10-CM

## 2019-08-17 ENCOUNTER — Other Ambulatory Visit: Payer: Self-pay | Admitting: Internal Medicine

## 2019-08-17 ENCOUNTER — Telehealth: Payer: Self-pay | Admitting: Family Medicine

## 2019-08-17 ENCOUNTER — Telehealth: Payer: Self-pay | Admitting: Pharmacist

## 2019-08-17 DIAGNOSIS — G43909 Migraine, unspecified, not intractable, without status migrainosus: Secondary | ICD-10-CM

## 2019-08-17 MED ORDER — SUMATRIPTAN 20 MG/ACT NA SOLN: NASAL | 4 refills | Status: AC

## 2019-08-17 NOTE — Telephone Encounter (Signed)
Pt is requesting Imitrex SQ injection. Will forward to PCP to review.

## 2019-08-17 NOTE — Telephone Encounter (Signed)
Disregard

## 2019-08-17 NOTE — Telephone Encounter (Signed)
Patient was switched to Imitrex nasal spray because insurance does not cover the Imitrex injections. I just checked the Medicaid preferred drug list & they still do not cover the injections.  Perhaps her appointment that was missed on 08/13/2019 can be rescheduled in order to get additional information to attempt to get the injection covered.

## 2019-08-17 NOTE — Telephone Encounter (Signed)
Refill sent.   Marcy Siren, D.O. Primary Care at [facility]  08/17/2019, 11:19 AM

## 2019-08-17 NOTE — Telephone Encounter (Signed)
Patient called and requested for listed medication tot be refilled and sent to  [facility] [address] [place] - [address]  [address] [place], [place] [address]  SUMAtriptan (IMITREX STATDOSE SYSTEM) 6 MG/0.5ML Ivory Broad [[id number]] DISCONTINUED

## 2019-09-06 DIAGNOSIS — E041 Nontoxic single thyroid nodule: Secondary | ICD-10-CM | POA: Diagnosis not present

## 2019-09-06 DIAGNOSIS — Z0000 Encounter for general adult medical examination without abnormal findings: Secondary | ICD-10-CM | POA: Diagnosis not present

## 2019-09-06 DIAGNOSIS — G43009 Migraine without aura, not intractable, without status migrainosus: Secondary | ICD-10-CM | POA: Diagnosis not present

## 2019-09-06 DIAGNOSIS — Z1159 Encounter for screening for other viral diseases: Secondary | ICD-10-CM | POA: Diagnosis not present

## 2019-09-06 DIAGNOSIS — L719 Rosacea, unspecified: Secondary | ICD-10-CM | POA: Diagnosis not present

## 2019-09-06 DIAGNOSIS — Z1211 Encounter for screening for malignant neoplasm of colon: Secondary | ICD-10-CM | POA: Diagnosis not present

## 2019-09-06 DIAGNOSIS — Z13220 Encounter for screening for lipoid disorders: Secondary | ICD-10-CM | POA: Diagnosis not present

## 2019-09-07 ENCOUNTER — Other Ambulatory Visit: Payer: Self-pay | Admitting: Family Medicine

## 2019-09-07 DIAGNOSIS — Z1211 Encounter for screening for malignant neoplasm of colon: Secondary | ICD-10-CM | POA: Diagnosis not present

## 2019-09-07 DIAGNOSIS — E041 Nontoxic single thyroid nodule: Secondary | ICD-10-CM

## 2019-09-21 ENCOUNTER — Ambulatory Visit
Admission: RE | Admit: 2019-09-21 | Discharge: 2019-09-21 | Disposition: A | Discharge status: Home / Self Care | Payer: Medicaid Other | Source: Ambulatory Visit | Attending: Family Medicine | Admitting: Family Medicine

## 2019-09-21 DIAGNOSIS — E041 Nontoxic single thyroid nodule: Secondary | ICD-10-CM

## 2019-10-26 ENCOUNTER — Ambulatory Visit (INDEPENDENT_AMBULATORY_CARE_PROVIDER_SITE_OTHER): Payer: Medicaid Other | Admitting: Internal Medicine

## 2019-10-26 ENCOUNTER — Other Ambulatory Visit: Payer: Self-pay

## 2019-10-26 ENCOUNTER — Encounter: Payer: Self-pay | Admitting: Internal Medicine

## 2019-10-26 VITALS — BP 118/70 | HR 76 | Ht 61.0 in | Wt 153.2 lb

## 2019-10-26 DIAGNOSIS — E042 Nontoxic multinodular goiter: Secondary | ICD-10-CM | POA: Diagnosis not present

## 2019-10-26 NOTE — Progress Notes (Signed)
Name: April Callahan  MRN/ DOB: [id number], 1970-03-09    Age/ Sex: 49 y.o., female    PCP: Shon Hale, MD   Reason for Endocrinology Evaluation: MNG     Date of Initial Endocrinology Evaluation: 10/26/2019     HPI: [name] is a 49 y.o. female with a past medical history of MNG. The patient presented for initial endocrinology clinic visit on 10/26/2019 for consultative assistance with her MNG.   Pt has been diagnosed with MNG many years ago ~ 2015/20216 while living in [place] . She has an hx of benign FNA in 2017.    Recently she denied any increase in local neck symptoms   Denies prior exposure to radiation  No Biotin    NO  constipation or diarrhea  No depression/ anxiety  Has occasional palpitations   Mother with thyroid disease       HISTORY:  Past Medical History:  Past Medical History:  Diagnosis Date  . History of congenital dysplasia of hip   . History of thyroid nodule    nodule on thyroid  . Migraine    Past Surgical History:  Past Surgical History:  Procedure Laterality Date  . Bilateral hip replacement Bilateral 2011      Social History:  reports that she has never smoked. She has never used smokeless tobacco. She reports current alcohol use. She reports that she does not use drugs.  Family History: family history includes Breast cancer in her paternal aunt; Colon cancer in her maternal grandmother; Heart disease in her mother; Osteoarthritis in her father; Seizures in her mother; Stomach cancer in her maternal grandfather.   HOME MEDICATIONS: Allergies as of 10/26/2019   No Known Allergies     Medication List       Accurate as of October 26, 2019 11:45 AM. If you have any questions, ask your nurse or doctor.        ibuprofen 600 MG tablet Commonly known as: ADVIL Take 1 tablet (600 mg total) by mouth every 8 (eight) hours as needed. Take after eating   SUMAtriptan 20 MG/ACT nasal spray Commonly known as:  IMITREX 1 spray (20 mg) at onset of headache, repeat in 2 hours if needed; Max 2 sprays per 24 hours         REVIEW OF SYSTEMS: A comprehensive ROS was conducted with the patient and is negative except as per HPI     OBJECTIVE:  VS: BP 118/70 (BP Location: Left Arm, Patient Position: Sitting, Cuff Size: Large)   Pulse 76   Ht 5\' 1"  (1.549 m)   Wt 153 lb 3.2 oz (69.5 kg)   SpO2 96%   BMI 28.95 kg/m    Wt Readings from Last 3 Encounters:  10/26/19 153 lb 3.2 oz (69.5 kg)  03/27/19 130 lb (59 kg)  11/23/18 140 lb (63.5 kg)     EXAM: General: Pt appears well and is in NAD  Neck: General: Supple without adenopathy. Thyroid: Thyroid size normal.  Nodules appreciated on the right   Lungs: Clear with good BS bilat with no rales, rhonchi, or wheezes  Heart: Auscultation: RRR.  Abdomen: Normoactive bowel sounds, soft, nontender, without masses or organomegaly palpable  Extremities:  BL LE: No pretibial edema normal ROM and strength.  Skin: Hair: Texture and amount normal with gender appropriate distribution Skin Inspection: No rashes Skin Palpation: Skin temperature, texture, and thickness normal to palpation  Neuro: Cranial nerves: II - XII grossly  intact  Motor: Normal strength throughout DTRs: 2+ and symmetric in UE without delay in relaxation phase  Mental Status: Judgment, insight: Intact Orientation: Oriented to time, place, and person Mood and affect: No depression, anxiety, or agitation     DATA REVIEWED: 09/11/2019 TSH 1.05 uIU/mL      Thyroid Ultrasound 09/21/2019 Nodule # 1:  Location: Right; Superior  Maximum size: 3.4 cm; Other 2 dimensions: 1.6 cm x 1.0 cm  Composition: mixed cystic and solid (1)  Echogenicity: isoechoic (1)  Shape: not taller-than-wide (0)  Margins: ill-defined (0)  Echogenic foci: none (0)  ACR TI-RADS total points: 2.  ACR TI-RADS risk category: TR2 (2 points).  ACR TI-RADS recommendations:  Cystic nodule  does not meet criteria for surveillance or biopsy  _________________________________________________________  Nodule # 2:  Location: Left; Superior  Maximum size: 0.6 cm; Other 2 dimensions: 0.6 cm x 0.7 cm  Composition: spongiform (0)  ACR TI-RADS recommendations:  Spongiform nodule does not meet criteria for surveillance or biopsy  _________________________________________________________  Nodule # 3:  Location: Left; Inferior  Maximum size: 3.1 cm; Other 2 dimensions: 1.3 cm x 1.0 cm  Composition: mixed cystic and solid (1)  Echogenicity: isoechoic (1)  Shape: not taller-than-wide (0)  Margins: ill-defined (0)  Echogenic foci: none (0)  ACR TI-RADS total points: 2.  ACR TI-RADS risk category: TR2 (2 points).  ACR TI-RADS recommendations:  Cystic nodule does not meet criteria for surveillance or biopsy  _________________________________________________________  Nodule # 4:  Location: Left; Inferior  Maximum size: 3.3 cm; Other 2 dimensions: 2.1 cm x 1.9 cm  Composition: solid/almost completely solid (2)  Echogenicity: isoechoic (1)  Shape: not taller-than-wide (0)  Margins: ill-defined (0)  Echogenic foci: none (0)  ACR TI-RADS total points: 3.  ACR TI-RADS risk category: TR3 (3 points).  ACR TI-RADS recommendations:  Nodule meets criteria for biopsy  _________________________________________________________  No adenopathy  IMPRESSION: Left inferior/exophytic thyroid nodule (labeled 4, 3.3 cm, TR 3) meets criteria for biopsy, as designated by the newly established ACR TI-RADS criteria, and referral for biopsy is recommended.  ASSESSMENT/PLAN/RECOMMENDATIONS:   1. Multinodular Goiter:   - Pt is clinically and biochemically euthyroid  - No local neck symptoms  - Has a hx of benign FNA in 2017 ( Does not recall which side ) - Will proceed with FNA of the left inferior thyroid nodule     F/U in  1 yr   Signed electronically by: Lyndle Herrlich, MD  [facility] Endocrinology  [facility] [address] [place], [place] [address] Phone: [phone] FAX: [phone]   CC: Shon Hale, MD [address] [place] [address] Phone: [phone] Fax: [phone]   Return to Endocrinology clinic as below: No future appointments.

## 2019-10-28 DIAGNOSIS — E042 Nontoxic multinodular goiter: Secondary | ICD-10-CM | POA: Insufficient documentation

## 2019-10-30 ENCOUNTER — Inpatient Hospital Stay: Admission: RE | Admit: 2019-10-30 | Payer: Medicaid Other | Source: Ambulatory Visit

## 2019-11-07 ENCOUNTER — Other Ambulatory Visit (HOSPITAL_COMMUNITY)
Admission: RE | Admit: 2019-11-07 | Discharge: 2019-11-07 | Disposition: A | Discharge status: Home / Self Care | Payer: Medicaid Other | Source: Ambulatory Visit | Attending: Internal Medicine | Admitting: Internal Medicine

## 2019-11-07 ENCOUNTER — Ambulatory Visit
Admission: RE | Admit: 2019-11-07 | Discharge: 2019-11-07 | Disposition: A | Discharge status: Home / Self Care | Payer: Medicaid Other | Source: Ambulatory Visit | Attending: Internal Medicine | Admitting: Internal Medicine

## 2019-11-07 DIAGNOSIS — E041 Nontoxic single thyroid nodule: Secondary | ICD-10-CM | POA: Diagnosis not present

## 2019-11-07 DIAGNOSIS — E042 Nontoxic multinodular goiter: Secondary | ICD-10-CM

## 2019-11-08 LAB — CYTOLOGY - NON PAP

## 2019-11-09 ENCOUNTER — Telehealth: Payer: Self-pay | Admitting: Internal Medicine

## 2019-11-09 NOTE — Telephone Encounter (Signed)
Discussed left nodule  FNA results with the pt on 11/09/2019 at 11:45 AM    FINAL MICROSCOPIC DIAGNOSIS:  - Scant follicular epithelium present (Bethesda category I)      I have recommended proceeding with another FNA in 3 months. We also discussed other options such as short term follow up ultrasound vs lobectomy.    Pt is indecisive as this time and would like to think about this as she is in the process of moving.   We have agreed to schedule her for a f/u in 3 months to discuss these options again.   A message was sent to front desk    Abby Raelyn Mora, MD  [facility] Endocrinology  [facility] [address] [place], [place] [address] Phone: [phone] FAX: [phone]

## 2020-06-11 ENCOUNTER — Ambulatory Visit: Payer: Medicaid Other | Admitting: Orthopaedic Surgery

## 2020-06-18 ENCOUNTER — Ambulatory Visit: Payer: Medicaid Other | Admitting: Orthopaedic Surgery

## 2020-06-25 ENCOUNTER — Ambulatory Visit: Payer: Medicaid Other | Admitting: Orthopaedic Surgery

## 2020-06-26 ENCOUNTER — Other Ambulatory Visit: Payer: Self-pay

## 2020-06-26 ENCOUNTER — Encounter: Payer: Self-pay | Admitting: Orthopaedic Surgery

## 2020-06-26 ENCOUNTER — Ambulatory Visit (INDEPENDENT_AMBULATORY_CARE_PROVIDER_SITE_OTHER): Payer: Medicaid Other | Admitting: Orthopaedic Surgery

## 2020-06-26 ENCOUNTER — Ambulatory Visit (INDEPENDENT_AMBULATORY_CARE_PROVIDER_SITE_OTHER): Payer: Medicaid Other

## 2020-06-26 DIAGNOSIS — Z96642 Presence of left artificial hip joint: Secondary | ICD-10-CM

## 2020-06-26 DIAGNOSIS — Z96641 Presence of right artificial hip joint: Secondary | ICD-10-CM

## 2020-06-26 DIAGNOSIS — N751 Abscess of Bartholin's gland: Secondary | ICD-10-CM | POA: Diagnosis not present

## 2020-06-26 NOTE — Progress Notes (Signed)
   Office Visit Note   Patient: April Callahan           Date of Birth: [birth year]           MRN: [id number] Visit Date: 06/26/2020              Requested by: Shon Hale, MD [address] [place],  [place] [address] PCP: Shon Hale, MD   Assessment & Plan: Visit Diagnoses:  1. Status post total hip replacement, right   2. Status post total hip replacement, left     Plan: Impression is 11 years status post bilateral total hip replacements.  Should everything looks good from my standpoint.  She can follow-up with [place] as needed at this point.  Questions encouraged and answered.  Follow-Up Instructions: Return if symptoms worsen or fail to improve.   Orders:  Orders Placed This Encounter  Procedures  . XR HIPS BILAT W OR W/O PELVIS 3-4 VIEWS   No orders of the defined types were placed in this encounter.     Procedures: No procedures performed   Clinical Data: No additional findings.   Subjective: Chief Complaint  Patient presents with  . Right Leg - Pain    Ms. Bojarski is a 50 year old female here for follow-up status post bilateral total hip replacements.  She had them done about 11 years ago in [place].  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  She is on her feet 8 hours a day.  She does not have any real complaints regarding her hips.  Occasionally she has some thigh and hip discomfort with increased activity but she has also gained a little bit of weight recently.   Review of Systems   Objective: Vital Signs: There were no vitals taken for this visit.  Physical Exam  Ortho Exam Bilateral hips show fully healed surgical scars.  Excellent range of motion without any discomfort.  Strength is normal. Specialty Comments:  No specialty comments available.  Imaging: XR HIPS BILAT W OR W/O PELVIS 3-4 VIEWS  Result Date: 06/26/2020 Stable bilateral total hip replacements without complications.    PMFS  History: Patient Active Problem List   Diagnosis Date Noted  . Multinodular goiter 10/28/2019  . Status post total hip replacement, left 03/27/2019  . Status post total hip replacement, right 03/27/2019  . History of congenital dysplasia of hip 03/14/2019  . S/P hip replacement, bilateral 03/14/2019   Past Medical History:  Diagnosis Date  . History of congenital dysplasia of hip   . History of thyroid nodule    nodule on thyroid  . Migraine     Family History  Problem Relation Age of Onset  . Seizures Mother   . Heart disease Mother   . Osteoarthritis Father   . Colon cancer Maternal Grandmother   . Stomach cancer Maternal Grandfather   . Breast cancer Paternal Aunt     Past Surgical History:  Procedure Laterality Date  . Bilateral hip replacement Bilateral 2011   Social History   Occupational History  . Not on file  Tobacco Use  . Smoking status: Never Smoker  . Smokeless tobacco: Never Used  Substance and Sexual Activity  . Alcohol use: Yes    Comment: occaissionally  . Drug use: Never  . Sexual activity: Not on file
